# Patient Record
Sex: Male | Born: 1985 | Race: White | Hispanic: No | Marital: Single | Smoking: Never smoker
Health system: Southern US, Community
[De-identification: ages and names within clinical notes are randomized; demographics above are authoritative.]

## PROBLEM LIST (undated history)

## (undated) DIAGNOSIS — E669 Obesity, unspecified: Secondary | ICD-10-CM

## (undated) HISTORY — PX: TONSILLECTOMY: SUR1361

## (undated) HISTORY — PX: HERNIA REPAIR: SHX51

## (undated) HISTORY — PX: TRACHELECTOMY: SHX6586

---

## 2017-12-04 ENCOUNTER — Emergency Department (HOSPITAL_COMMUNITY): Payer: Self-pay

## 2017-12-04 ENCOUNTER — Encounter (HOSPITAL_COMMUNITY): Payer: Self-pay | Admitting: *Deleted

## 2017-12-04 ENCOUNTER — Other Ambulatory Visit: Payer: Self-pay

## 2017-12-04 ENCOUNTER — Emergency Department (HOSPITAL_COMMUNITY)
Admission: EM | Admit: 2017-12-04 | Discharge: 2017-12-04 | Disposition: A | Discharge status: Home / Self Care | Payer: Self-pay | Attending: Emergency Medicine | Admitting: Emergency Medicine

## 2017-12-04 DIAGNOSIS — B199 Unspecified viral hepatitis without hepatic coma: Secondary | ICD-10-CM | POA: Insufficient documentation

## 2017-12-04 DIAGNOSIS — K759 Inflammatory liver disease, unspecified: Secondary | ICD-10-CM

## 2017-12-04 DIAGNOSIS — E119 Type 2 diabetes mellitus without complications: Secondary | ICD-10-CM | POA: Insufficient documentation

## 2017-12-04 LAB — AMMONIA: Ammonia: 39 umol/L — ABNORMAL HIGH (ref 9–35)

## 2017-12-04 LAB — URINALYSIS, ROUTINE W REFLEX MICROSCOPIC
Bilirubin Urine: NEGATIVE
Glucose, UA: NEGATIVE mg/dL
Hgb urine dipstick: NEGATIVE
Ketones, ur: NEGATIVE mg/dL
Leukocytes, UA: NEGATIVE
Nitrite: NEGATIVE
PH: 5 (ref 5.0–8.0)
Protein, ur: NEGATIVE mg/dL
SPECIFIC GRAVITY, URINE: 1.023 (ref 1.005–1.030)
Squamous Epithelial / LPF: NONE SEEN

## 2017-12-04 LAB — COMPREHENSIVE METABOLIC PANEL
ALK PHOS: 126 U/L (ref 38–126)
ALT: 505 U/L — AB (ref 17–63)
AST: 268 U/L — ABNORMAL HIGH (ref 15–41)
Albumin: 3.3 g/dL — ABNORMAL LOW (ref 3.5–5.0)
Anion gap: 7 (ref 5–15)
BUN: 9 mg/dL (ref 6–20)
CALCIUM: 8.7 mg/dL — AB (ref 8.9–10.3)
CO2: 21 mmol/L — AB (ref 22–32)
CREATININE: 0.81 mg/dL (ref 0.61–1.24)
Chloride: 102 mmol/L (ref 101–111)
GFR calc non Af Amer: >60 mL/min (ref >60)
GLUCOSE: 114 mg/dL — AB (ref 65–99)
Potassium: 3.6 mmol/L (ref 3.5–5.1)
SODIUM: 130 mmol/L — AB (ref 135–145)
Total Bilirubin: 1 mg/dL (ref 0.3–1.2)
Total Protein: 7.8 g/dL (ref 6.5–8.1)

## 2017-12-04 LAB — CBC
HCT: 41.1 % (ref 39.0–52.0)
Hemoglobin: 12.9 g/dL — ABNORMAL LOW (ref 13.0–17.0)
MCH: 24.9 pg — AB (ref 26.0–34.0)
MCHC: 31.4 g/dL (ref 30.0–36.0)
MCV: 79.3 fL (ref 78.0–100.0)
PLATELETS: 264 10*3/uL (ref 150–400)
RBC: 5.18 MIL/uL (ref 4.22–5.81)
RDW: 17 % — AB (ref 11.5–15.5)
WBC: 7.8 10*3/uL (ref 4.0–10.5)

## 2017-12-04 LAB — PROTIME-INR
INR: 1.14
PROTHROMBIN TIME: 14.6 s (ref 11.4–15.2)

## 2017-12-04 LAB — LIPASE, BLOOD: Lipase: 18 U/L (ref 11–51)

## 2017-12-04 MED ORDER — ONDANSETRON HCL 4 MG/2ML IJ SOLN
4.0000 mg | Freq: Once | INTRAMUSCULAR | Status: AC
  Administered 2017-12-04: 4 mg via INTRAVENOUS
  Filled 2017-12-04: qty 2

## 2017-12-04 MED ORDER — IOPAMIDOL (ISOVUE-300) INJECTION 61%
INTRAVENOUS | Status: AC
  Filled 2017-12-04: qty 30

## 2017-12-04 MED ORDER — SODIUM CHLORIDE 0.9 % IV BOLUS (SEPSIS)
1000.0000 mL | Freq: Once | INTRAVENOUS | Status: AC
  Administered 2017-12-04: 1000 mL via INTRAVENOUS

## 2017-12-04 MED ORDER — IOPAMIDOL (ISOVUE-300) INJECTION 61%
INTRAVENOUS | Status: AC
  Administered 2017-12-04: 100 mL
  Filled 2017-12-04: qty 75

## 2017-12-04 NOTE — ED Triage Notes (Signed)
Pt is here with nausea, weakness, and vomiting for 4 days.  Abdominal pain as well.  Hernia repair in September. He thinks the Mesh is messed up. Rectal bleeding and states now he is vomiting blood.

## 2017-12-04 NOTE — ED Notes (Signed)
Patient returned from CT

## 2017-12-04 NOTE — ED Notes (Signed)
Patient transported to CT 

## 2017-12-04 NOTE — Discharge Instructions (Signed)
Thank you for allowing us to provide your care. Please ensure you see a primary care doctor as soon as possible as you will need repeat labs.

## 2017-12-04 NOTE — ED Provider Notes (Signed)
[facility] EMERGENCY DEPARTMENT Provider Note   CSN: [id number] Arrival date & time: 12/04/17  1136  History   Chief Complaint No chief complaint on file.  HPI Jimmy Mccormick is a 31 y.o. male with a past surgical history significant for a hernia repair done in 08/2017 who presented to the ED with 4 days of N/V and new onset hematemesis. He states that on Friday he started to feel significant abdominal pain then began to have N/V with PO intake. Yesterday evening he began to experience bright red blood in his vomit. He states that it appears to be coming from his nose and when he blows his nose he gets nothing but blood. He also has been experiencing bright red blood on the toilet paper after a bowel movement but this has been going on for a while. He denies struggles with constipation. He is otherwise healthy and denies any past medical history. He has used crack cocaine in the past but does not snort drugs. He denies IV drugs. He has been experiencing dizziness with standing and diaphoresis. He is sexually active with females but states that it is only oral but he does not give oral. He has never had an STI. He denies headaches, visual changes, rhinorrhea, cough, sore throat, new lumps or bumps, abdominal pain, diarrhea, new myalgias, new arthralgias, new rash.   Past Medical History:  Diagnosis Date  . Diabetes mellitus without complication (HCC)     There are no active problems to display for this patient.  Past Surgical History:  Procedure Laterality Date  . HERNIA REPAIR    . TONSILLECTOMY    . TRACHELECTOMY      Home Medications    Prior to Admission medications   Not on File   Family History No family history on file.  Social History Social History   Tobacco Use  . Smoking status: Never Smoker  . Smokeless tobacco: Never Used  Substance Use Topics  . Alcohol use: No    Frequency: Never  . Drug use: Yes    Types: Cocaine   Allergies   Patient  has no known allergies.  Review of Systems All systems reviewed and are negative for acute change except as noted in the HPI.  Physical Exam Updated Vital Signs BP 115/63   Pulse 94   Temp 98.6 F (37 C) (Oral)   Resp 17   SpO2 100%   General: Morbidly obese male in no acute distress HENT: Normocephalic, atraumatic, nares are erythematous without gross blood noted, oropharynx is clear without exudates or erythema Pulm: Good air movement with no wheezing or crackles CV: RRR, no murmurs, no rubs  Abdomen: Active bowel sounds, soft, non-distended, diffuse tenderness to palpation without rebound or guarding  Extremities: No LE edema, Radial pulses palpable  Skin: Warm and dry, no rashes noted Neuro: Alert and oriented x 3, cranial nerves intact bilaterally, gross strength 5/5 in all extremities, sensation to light touch intact bilaterally   ED Treatments / Results  Labs (all labs ordered are listed, but only abnormal results are displayed) Labs Reviewed  COMPREHENSIVE METABOLIC PANEL - Abnormal; Notable for the following components:      Result Value   Sodium 130 (*)    CO2 21 (*)    Glucose, Bld 114 (*)    Calcium 8.7 (*)    Albumin 3.3 (*)    AST 268 (*)    ALT 505 (*)    All other components within normal limits  CBC - Abnormal; Notable for the following components:   Hemoglobin 12.9 (*)    MCH 24.9 (*)    RDW 17.0 (*)    All other components within normal limits  URINALYSIS, ROUTINE W REFLEX MICROSCOPIC - Abnormal; Notable for the following components:   Color, Urine AMBER (*)    APPearance TURBID (*)    Bacteria, UA RARE (*)    All other components within normal limits  AMMONIA - Abnormal; Notable for the following components:   Ammonia 39 (*)    All other components within normal limits  LIPASE, BLOOD  PROTIME-INR  HEPATITIS PANEL, ACUTE  HIV ANTIBODY (ROUTINE TESTING)   EKG  EKG Interpretation None      Radiology Dg Chest 2 View  Result Date:  12/04/2017 CLINICAL DATA:  Nausea and vomiting.  Hematemesis. EXAM: CHEST  2 VIEW COMPARISON:  None. FINDINGS: The heart size and mediastinal contours are within normal limits. Both lungs are clear. The visualized skeletal structures are unremarkable. IMPRESSION: Normal exam. Electronically Signed   By: Francene BoyersJames  Maxwell M.D.   On: 12/04/2017 18:18   Ct Abdomen Pelvis W Contrast  Result Date: 12/04/2017 CLINICAL DATA:  Nausea and vomiting EXAM: CT ABDOMEN AND PELVIS WITH CONTRAST TECHNIQUE: Multidetector CT imaging of the abdomen and pelvis was performed using the standard protocol following bolus administration of intravenous contrast. CONTRAST:  100mL ISOVUE-300 IOPAMIDOL (ISOVUE-300) INJECTION 61% COMPARISON:  07/23/2017 FINDINGS: Lower chest: No acute abnormality. Hepatobiliary: No focal liver abnormality is seen. No gallstones, gallbladder wall thickening, or biliary dilatation. Pancreas: Unremarkable. No pancreatic ductal dilatation or surrounding inflammatory changes. Spleen: Normal in size without focal abnormality. Adrenals/Urinary Tract: Adrenal glands are unremarkable. Kidneys are normal, without renal calculi, focal lesion, or hydronephrosis. Bladder is unremarkable. Stomach/Bowel: Stomach is within normal limits. Appendix not well seen but no right lower quadrant inflammation. No evidence of bowel wall thickening, distention, or inflammatory changes. Vascular/Lymphatic: No significant vascular findings are present. No enlarged abdominal or pelvic lymph nodes. Reproductive: Prostate is unremarkable. Other: Interval repair of previously noted fat containing umbilical hernia with minimal surgical changes in the region. No free air or free fluid. Musculoskeletal: No acute or significant osseous findings. IMPRESSION: 1. No CT evidence for acute intra-abdominal or pelvic abnormality. Negative for a bowel obstruction or bowel wall thickening 2. Interval repair of umbilical hernia. No focal fluid collection  or inflammatory process in this region. Electronically Signed   By: Jasmine PangKim  Fujinaga M.D.   On: 12/04/2017 20:44   Procedures Procedures (including critical care time)  Medications Ordered in ED Medications  iopamidol (ISOVUE-300) 61 % injection (not administered)  ondansetron (ZOFRAN) injection 4 mg (4 mg Intravenous Given 12/04/17 2000)  sodium chloride 0.9 % bolus 1,000 mL (0 mLs Intravenous Stopped 12/04/17 2127)  iopamidol (ISOVUE-300) 61 % injection (100 mLs  Contrast Given 12/04/17 2014)   Initial Impression / Assessment and Plan / ED Course  I have reviewed the triage vital signs and the nursing notes.  Pertinent labs & imaging results that were available during my care of the patient were reviewed by me and considered in my medical decision making (see chart for details).    31 y.o male with a history of polysubstance abuse who presented to the ED with 4 days of N/V and blood nose. CMP was remarkable for transaminitis. Acute hepatitis panel was ordered. CT of the abdomen illustrated no gall bladder pathology or other acute intraabdominal pathology. Patient is hemodynamically stable and tolerating PO intake with out difficulty.  He would like to go home. We discussed that he will need to follow-up with a primary care doctor as soon as possible and need repeat labs. He voices understanding and states that he will follow-up. Standard return precautions given. Patient is stable for discharge.   Pending labs: HIV and acute hepatitis panel  Final Clinical Impressions(s) / ED Diagnoses   Final diagnoses:  Hepatitis   ED Discharge Orders    None       Levora Dredge, MD 12/04/17 2327    Tegeler, Canary Brim, MD 12/05/17 [phone]

## 2017-12-05 LAB — HIV ANTIBODY (ROUTINE TESTING W REFLEX): HIV Screen 4th Generation wRfx: NONREACTIVE

## 2017-12-06 LAB — HEPATITIS PANEL, ACUTE
HCV Ab: >11 s/co ratio — ABNORMAL HIGH (ref 0.0–0.9)
HEP B S AG: NEGATIVE
Hep A IgM: NEGATIVE
Hep B C IgM: NEGATIVE

## 2017-12-31 ENCOUNTER — Emergency Department (HOSPITAL_BASED_OUTPATIENT_CLINIC_OR_DEPARTMENT_OTHER)
Admission: EM | Admit: 2017-12-31 | Discharge: 2017-12-31 | Disposition: A | Discharge status: Home / Self Care | Payer: Self-pay | Attending: Emergency Medicine | Admitting: Emergency Medicine

## 2017-12-31 ENCOUNTER — Other Ambulatory Visit: Payer: Self-pay

## 2017-12-31 ENCOUNTER — Emergency Department (HOSPITAL_BASED_OUTPATIENT_CLINIC_OR_DEPARTMENT_OTHER): Payer: Self-pay

## 2017-12-31 ENCOUNTER — Encounter (HOSPITAL_BASED_OUTPATIENT_CLINIC_OR_DEPARTMENT_OTHER): Payer: Self-pay | Admitting: Emergency Medicine

## 2017-12-31 DIAGNOSIS — Y999 Unspecified external cause status: Secondary | ICD-10-CM | POA: Insufficient documentation

## 2017-12-31 DIAGNOSIS — S0083XA Contusion of other part of head, initial encounter: Secondary | ICD-10-CM | POA: Insufficient documentation

## 2017-12-31 DIAGNOSIS — Z23 Encounter for immunization: Secondary | ICD-10-CM | POA: Insufficient documentation

## 2017-12-31 DIAGNOSIS — Y929 Unspecified place or not applicable: Secondary | ICD-10-CM | POA: Insufficient documentation

## 2017-12-31 DIAGNOSIS — E119 Type 2 diabetes mellitus without complications: Secondary | ICD-10-CM | POA: Insufficient documentation

## 2017-12-31 DIAGNOSIS — H1131 Conjunctival hemorrhage, right eye: Secondary | ICD-10-CM | POA: Insufficient documentation

## 2017-12-31 DIAGNOSIS — Y939 Activity, unspecified: Secondary | ICD-10-CM | POA: Insufficient documentation

## 2017-12-31 HISTORY — DX: Obesity, unspecified: E66.9

## 2017-12-31 MED ORDER — TETANUS-DIPHTH-ACELL PERTUSSIS 5-2.5-18.5 LF-MCG/0.5 IM SUSP
0.5000 mL | Freq: Once | INTRAMUSCULAR | Status: AC
  Administered 2017-12-31: 0.5 mL via INTRAMUSCULAR
  Filled 2017-12-31: qty 0.5

## 2017-12-31 MED ORDER — ERYTHROMYCIN 5 MG/GM OP OINT: TOPICAL_OINTMENT | OPHTHALMIC | 0 refills | Status: AC

## 2017-12-31 MED ORDER — ACETAMINOPHEN 500 MG PO TABS
1000.0000 mg | ORAL_TABLET | Freq: Once | ORAL | Status: AC
  Administered 2017-12-31: 1000 mg via ORAL
  Filled 2017-12-31: qty 2

## 2017-12-31 NOTE — ED Notes (Signed)
Patient transported to CT 
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
## 2018-03-10 ENCOUNTER — Other Ambulatory Visit: Payer: Self-pay

## 2018-03-10 ENCOUNTER — Emergency Department (HOSPITAL_BASED_OUTPATIENT_CLINIC_OR_DEPARTMENT_OTHER)
Admission: EM | Admit: 2018-03-10 | Discharge: 2018-03-10 | Disposition: A | Discharge status: Home / Self Care | Payer: Self-pay | Attending: Emergency Medicine | Admitting: Emergency Medicine

## 2018-03-10 ENCOUNTER — Encounter (HOSPITAL_BASED_OUTPATIENT_CLINIC_OR_DEPARTMENT_OTHER): Payer: Self-pay | Admitting: *Deleted

## 2018-03-10 DIAGNOSIS — L03319 Cellulitis of trunk, unspecified: Secondary | ICD-10-CM | POA: Insufficient documentation

## 2018-03-10 DIAGNOSIS — L02219 Cutaneous abscess of trunk, unspecified: Secondary | ICD-10-CM | POA: Insufficient documentation

## 2018-03-10 DIAGNOSIS — R1030 Lower abdominal pain, unspecified: Secondary | ICD-10-CM | POA: Insufficient documentation

## 2018-03-10 MED ORDER — DOXYCYCLINE HYCLATE 100 MG PO CAPS: 100.0000 mg | ORAL_CAPSULE | Freq: Two times a day (BID) | ORAL | 0 refills | Status: AC

## 2018-03-10 MED ORDER — DOXYCYCLINE HYCLATE 100 MG PO TABS
100.0000 mg | ORAL_TABLET | Freq: Once | ORAL | Status: AC
  Administered 2018-03-10: 100 mg via ORAL
  Filled 2018-03-10: qty 1

## 2018-03-10 MED ORDER — LIDOCAINE-EPINEPHRINE (PF) 2 %-1:200000 IJ SOLN
20.0000 mL | Freq: Once | INTRAMUSCULAR | Status: AC
  Administered 2018-03-10: 20 mL
  Filled 2018-03-10: qty 20

## 2018-03-10 NOTE — ED Provider Notes (Signed)
[facility] EMERGENCY DEPARTMENT Provider Note   CSN: [id number] Arrival date & time: 03/10/18  0156     History   Chief Complaint Chief Complaint  Patient presents with  . absess/cellulitis    HPI Jimmy Mccormick is a 32 y.o. male.  HPI  This is a 32 year old male who presents with redness and pain in the right lower abdomen.  Patient reports 3-4-day history of increasing redness and pain to the right lower abdomen.  He initially thought it was an abscess.  However today had extensive redness and worsening 10.  He does not take anything for pain.  Denies any history of abscesses.  Denies any fevers.  Denies any spontaneous drainage.  Past Medical History:  Diagnosis Date  . Obesity     There are no active problems to display for this patient.   Past Surgical History:  Procedure Laterality Date  . HERNIA REPAIR    . TONSILLECTOMY    . TRACHELECTOMY          Home Medications    Prior to Admission medications   Medication Sig Start Date End Date Taking? Authorizing Provider  doxycycline (VIBRAMYCIN) 100 MG capsule Take 1 capsule (100 mg total) by mouth 2 (two) times daily. 03/10/18   Avrianna Smart, Mayer Maskerourtney F, MD  erythromycin ophthalmic ointment Place a 1/2 inch ribbon of ointment into the lower eyelid. 12/31/17   Palumbo, April, MD    Family History No family history on file.  Social History Social History   Tobacco Use  . Smoking status: Never Smoker  . Smokeless tobacco: Never Used  Substance Use Topics  . Alcohol use: Yes    Frequency: Never    Comment: social  . Drug use: Not Currently     Allergies   Patient has no known allergies.   Review of Systems Review of Systems  Constitutional: Negative for fever.  Respiratory: Negative for shortness of breath.   Cardiovascular: Negative for chest pain.  Gastrointestinal: Positive for abdominal pain.  Skin: Positive for color change.  All other systems reviewed and are negative.    Physical  Exam Updated Vital Signs BP 110/73   Pulse 99   Temp 99.3 F (37.4 C)   Resp 20   Ht 5\' 5"  (1.651 m)   Wt (!) 158.8 kg (350 lb)   SpO2 94%   BMI 58.24 kg/m   Physical Exam  Constitutional: He is oriented to person, place, and time. He appears well-developed and well-nourished.  Morbidly obese  HENT:  Head: Normocephalic and atraumatic.  Cardiovascular: Normal rate and regular rhythm.  Pulmonary/Chest: Effort normal. No respiratory distress.  Abdominal: Soft. Bowel sounds are normal. There is no tenderness. There is no rebound.  Fluctuance and induration just noted inferior and right lateral of the umbilicus, no spontaneous drainage noted, extensive surrounding erythema, no crepitus  Musculoskeletal: He exhibits no edema.  Neurological: He is alert and oriented to person, place, and time.  Skin: Skin is warm and dry.  Psychiatric: He has a normal mood and affect.  Nursing note and vitals reviewed.    ED Treatments / Results  Labs (all labs ordered are listed, but only abnormal results are displayed) Labs Reviewed - No data to display  EKG None  Radiology No results found.  Procedures .Marland Kitchen.Incision and Drainage Date/Time: 03/10/2018 2:48 AM Performed by: Shon BatonHorton, Lipa Knauff F, MD Authorized by: Shon BatonHorton, Gumaro Brightbill F, MD   Consent:    Consent obtained:  Verbal   Consent given by:  Patient   Risks discussed:  Bleeding, incomplete drainage and infection   Alternatives discussed:  No treatment Location:    Type:  Abscess   Size:  2x2   Location:  Trunk   Trunk location:  Abdomen Pre-procedure details:    Skin preparation:  Chloraprep and Betadine Anesthesia (see MAR for exact dosages):    Anesthesia method:  Local infiltration   Local anesthetic:  Lidocaine 2% WITH epi Procedure type:    Complexity:  Simple Procedure details:    Needle aspiration: no     Incision types:  Stab incision   Incision depth:  Dermal   Scalpel blade:  11   Wound management:  Probed and  deloculated   Drainage:  Bloody and purulent   Drainage amount:  Scant   Packing materials:  1/2 in iodoform gauze Post-procedure details:    Patient tolerance of procedure:  Tolerated well, no immediate complications   (including critical care time)  Medications Ordered in ED Medications  lidocaine-EPINEPHrine (XYLOCAINE W/EPI) 2 %-1:200000 (PF) injection 20 mL (has no administration in time range)  doxycycline (VIBRA-TABS) tablet 100 mg (has no administration in time range)     Initial Impression / Assessment and Plan / ED Course  I have reviewed the triage vital signs and the nursing notes.  Pertinent labs & imaging results that were available during my care of the patient were reviewed by me and considered in my medical decision making (see chart for details).     Patient presents with right lower abdominal pain and redness.  On exam he has evidence of cellulitis and likely abscess.  Incision and drainage was performed.  Minimal purulent drainage but large pocket accessed.  Suspect this may have previously spontaneously drained.  He has extensive surrounding cellulitis.  He is otherwise afebrile and nontoxic-appearing.  Patient was given a dose of doxycycline.  Recommend wound follow-up in 2 days.  If he develops systemic symptoms, fevers, or worsening redness he needs to be reevaluated immediately.  After history, exam, and medical workup I feel the patient has been appropriately medically screened and is safe for discharge home. Pertinent diagnoses were discussed with the patient. Patient was given return precautions.   Final Clinical Impressions(s) / ED Diagnoses   Final diagnoses:  Cellulitis and abscess of trunk    ED Discharge Orders        Ordered    doxycycline (VIBRAMYCIN) 100 MG capsule  2 times daily     03/10/18 0245       Dyann Goodspeed, Mayer Masker, MD 03/10/18 [phone]

## 2018-03-10 NOTE — ED Triage Notes (Signed)
C/o redness to right lower abdomen for past week. States pain was worse today. Right lower abdomen with large area of redness and 50 cent size abscess in the middle. Denies fevers.

## 2018-03-10 NOTE — Discharge Instructions (Addendum)
You were seen today for abscess and cellulitis of her abdominal wall.  Take antibiotics as instructed.  If you develop fevers or redness extending further into the abdomen, you should be reevaluated immediately.

## 2018-03-11 ENCOUNTER — Encounter (HOSPITAL_BASED_OUTPATIENT_CLINIC_OR_DEPARTMENT_OTHER): Payer: Self-pay | Admitting: Emergency Medicine

## 2018-03-11 ENCOUNTER — Emergency Department (HOSPITAL_BASED_OUTPATIENT_CLINIC_OR_DEPARTMENT_OTHER)
Admission: EM | Admit: 2018-03-11 | Discharge: 2018-03-11 | Disposition: A | Discharge status: Home / Self Care | Payer: Self-pay | Attending: Emergency Medicine | Admitting: Emergency Medicine

## 2018-03-11 ENCOUNTER — Other Ambulatory Visit: Payer: Self-pay

## 2018-03-11 DIAGNOSIS — Z79899 Other long term (current) drug therapy: Secondary | ICD-10-CM | POA: Insufficient documentation

## 2018-03-11 DIAGNOSIS — L03311 Cellulitis of abdominal wall: Secondary | ICD-10-CM | POA: Insufficient documentation

## 2018-03-11 MED ORDER — MORPHINE SULFATE 15 MG PO TABS: 15.0000 mg | ORAL_TABLET | ORAL | 0 refills | Status: AC | PRN

## 2018-03-11 NOTE — ED Triage Notes (Signed)
Was here yesterday and told to come back today for recheck and red area is getting worse.

## 2018-03-11 NOTE — Discharge Instructions (Addendum)
Return for fever, nausea and vomiting, or rapid spreading redness.  Use warm compresses 4x a day.

## 2018-03-11 NOTE — ED Provider Notes (Signed)
[facility] EMERGENCY DEPARTMENT Provider Note   CSN: [id number] Arrival date & time: 03/11/18  0550     History   Chief Complaint Chief Complaint  Patient presents with  . Wound Check    HPI Jimmy Mccormick is a 32 y.o. male.  32 yo M with a cc of skin infection. The patient was seen less than 24 hours ago for the same.  He had an I&D of an abscess and was started on doxycycline.  The patient is concerned because the erythema has spread beyond the marked area.  He is concerned that he needs to come in for IV antibiotics.  He denies fevers or chills.  Had some mild nausea with his first dose of doxycycline.   The history is provided by the patient.  Illness  This is a new problem. The current episode started yesterday. The problem occurs constantly. The problem has not changed since onset.Pertinent negatives include no chest pain, no abdominal pain, no headaches and no shortness of breath. Nothing aggravates the symptoms. Nothing relieves the symptoms. He has tried nothing for the symptoms. The treatment provided no relief.    Past Medical History:  Diagnosis Date  . Obesity     There are no active problems to display for this patient.   Past Surgical History:  Procedure Laterality Date  . HERNIA REPAIR    . TONSILLECTOMY    . TRACHELECTOMY          Home Medications    Prior to Admission medications   Medication Sig Start Date End Date Taking? Authorizing Provider  doxycycline (VIBRAMYCIN) 100 MG capsule Take 1 capsule (100 mg total) by mouth 2 (two) times daily. 03/10/18  Yes Horton, Mayer Maskerourtney F, MD  erythromycin ophthalmic ointment Place a 1/2 inch ribbon of ointment into the lower eyelid. 12/31/17   Palumbo, April, MD  morphine (MSIR) 15 MG tablet Take 1 tablet (15 mg total) by mouth every 4 (four) hours as needed for severe pain. 03/11/18   Melene PlanFloyd, Sweta Halseth, DO    Family History No family history on file.  Social History Social History   Tobacco Use  .  Smoking status: Never Smoker  . Smokeless tobacco: Never Used  Substance Use Topics  . Alcohol use: Yes    Frequency: Never    Comment: social  . Drug use: Not Currently     Allergies   Patient has no known allergies.   Review of Systems Review of Systems  Constitutional: Negative for chills and fever.  HENT: Negative for congestion and facial swelling.   Eyes: Negative for discharge and visual disturbance.  Respiratory: Negative for shortness of breath.   Cardiovascular: Negative for chest pain and palpitations.  Gastrointestinal: Negative for abdominal pain, diarrhea and vomiting.  Musculoskeletal: Negative for arthralgias and myalgias.  Skin: Positive for color change and wound. Negative for rash.  Neurological: Negative for tremors, syncope and headaches.  Psychiatric/Behavioral: Negative for confusion and dysphoric mood.     Physical Exam Updated Vital Signs BP 136/79 (BP Location: Right Arm)   Pulse (!) 101   Temp 98.3 F (36.8 C) (Oral)   Resp (!) 28   Ht 5\' 5"  (1.651 m)   Wt (!) 158.8 kg (350 lb)   SpO2 97%   BMI 58.24 kg/m   Physical Exam  Constitutional: He is oriented to person, place, and time. He appears well-developed and well-nourished.  Morbidly obese  HENT:  Head: Normocephalic and atraumatic.  Eyes: Pupils are equal, round,  and reactive to light. EOM are normal.  Neck: Normal range of motion. Neck supple. No JVD present.  Cardiovascular: Normal rate and regular rhythm. Exam reveals no gallop and no friction rub.  No murmur heard. Pulmonary/Chest: No respiratory distress. He has no wheezes.  Abdominal: He exhibits no distension and no mass. There is no tenderness. There is no rebound and no guarding.  Musculoskeletal: Normal range of motion.  Neurological: He is alert and oriented to person, place, and time.  Skin: No rash noted. No pallor.     Abscess without drainage, mild induration.   Mildly warm to touch  Psychiatric: He has a normal  mood and affect. His behavior is normal.  Nursing note and vitals reviewed.    ED Treatments / Results  Labs (all labs ordered are listed, but only abnormal results are displayed) Labs Reviewed - No data to display  EKG None  Radiology No results found.  Procedures Procedures (including critical care time)  Medications Ordered in ED Medications - No data to display   Initial Impression / Assessment and Plan / ED Course  I have reviewed the triage vital signs and the nursing notes.  Pertinent labs & imaging results that were available during my care of the patient were reviewed by me and considered in my medical decision making (see chart for details).     32 yo M with a chief complaint of a cellulitis to his abdominal wall.  The abscess is mildly indurated but has no continued purulent drainage.  The erythema has extended just beyond the marked area.  The patient is obviously dirty and has not clean himself since his skin was marked.  I discussed with him good hygiene.  As he is not febrile is well-appearing will have him continue on doxycycline.  Stressed warm compresses.  Given return precautions.  7:25 AM:  I have discussed the diagnosis/risks/treatment options with the patient and family and believe the pt to be eligible for discharge home to follow-up with PCP. We also discussed returning to the ED immediately if new or worsening sx occur. We discussed the sx which are most concerning (e.g., sudden worsening pain, fever, inability to tolerate by mouth, rapid spreading redness) that necessitate immediate return. Medications administered to the patient during their visit and any new prescriptions provided to the patient are listed below.  Medications given during this visit Medications - No data to display   The patient appears reasonably screen and/or stabilized for discharge and I doubt any other medical condition or other [facility] requiring further screening, evaluation, or  treatment in the ED at this time prior to discharge.    Final Clinical Impressions(s) / ED Diagnoses   Final diagnoses:  Cellulitis of abdominal wall    ED Discharge Orders        Ordered    morphine (MSIR) 15 MG tablet  Every 4 hours PRN     03/11/18 0714       Melene Plan, DO 03/11/18 1610
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
